# Patient Record
Sex: Male | Born: 1980 | Race: White | Hispanic: No | Marital: Married | Smoking: Never smoker
Health system: Southern US, Community
[De-identification: ages and names within clinical notes are randomized; demographics above are authoritative.]

## PROBLEM LIST (undated history)

## (undated) DIAGNOSIS — J309 Allergic rhinitis, unspecified: Secondary | ICD-10-CM

## (undated) DIAGNOSIS — K760 Fatty (change of) liver, not elsewhere classified: Secondary | ICD-10-CM

## (undated) DIAGNOSIS — K5100 Ulcerative (chronic) pancolitis without complications: Secondary | ICD-10-CM

## (undated) HISTORY — PX: CHOLECYSTECTOMY: SHX55

## (undated) HISTORY — PX: COLONOSCOPY: SHX174

---

## 2006-03-17 ENCOUNTER — Ambulatory Visit: Payer: Self-pay | Admitting: Unknown Physician Specialty

## 2010-06-04 ENCOUNTER — Ambulatory Visit: Payer: Self-pay

## 2010-09-03 ENCOUNTER — Ambulatory Visit: Payer: Self-pay | Admitting: Unknown Physician Specialty

## 2010-09-06 LAB — PATHOLOGY REPORT

## 2010-12-03 ENCOUNTER — Emergency Department: Payer: Self-pay | Admitting: Emergency Medicine

## 2012-01-03 ENCOUNTER — Ambulatory Visit: Payer: Self-pay | Admitting: Internal Medicine

## 2012-01-05 ENCOUNTER — Ambulatory Visit: Payer: Self-pay | Admitting: Anesthesiology

## 2012-01-11 ENCOUNTER — Ambulatory Visit: Payer: Self-pay | Admitting: Surgery

## 2012-01-12 LAB — PATHOLOGY REPORT

## 2014-02-07 ENCOUNTER — Ambulatory Visit: Payer: Self-pay | Admitting: Unknown Physician Specialty

## 2014-02-11 LAB — PATHOLOGY REPORT

## 2014-02-14 ENCOUNTER — Ambulatory Visit: Payer: Self-pay | Admitting: Unknown Physician Specialty

## 2015-03-08 NOTE — Op Note (Signed)
PATIENT NAME:  Hector LyonsKINS, Hector Hill MR#:  [id number] DATE OF BIRTH:  04-21-1981  DATE OF PROCEDURE:  01/11/2012  PREOPERATIVE DIAGNOSIS: Chronic cholecystitis.   POSTOPERATIVE DIAGNOSIS: Chronic cholecystitis, cholelithiasis.   PROCEDURE: Laparoscopic cholecystectomy, cholangiogram.   SURGEON: Renda RollsWilton Smith, MD   ANESTHESIA: General.   INDICATIONS: This 34 year old male has a history of epigastric pains. He had ultrasound findings that demonstrated some sludge within the gallbladder, some thickening of the gallbladder wall. Surgery was recommended for definitive treatment.   DESCRIPTION OF PROCEDURE: The patient was placed on the operating table in the supine position under general endotracheal anesthesia. The abdomen was clipped and prepared with ChloraPrep and draped in a sterile manner.   The patient was quite obese. An incision was made deep within the inferior aspect of the umbilicus, carried down to the deep fascia which was grasped with a laryngeal hook and elevated. A Veress needle was inserted, aspirated, and irrigated with a saline solution. Next, the peritoneal cavity was inflated with carbon dioxide. The Veress needle was removed. The 10 mm cannula was inserted. The 10 mm, 0-degree laparoscope was inserted to view the peritoneal cavity. Another incision was made in the epigastrium just to the right of the midline to introduce a 10 mm cannula. Two incisions were made in the lateral aspect of the right upper quadrant to introduce two 5-mm cannulas.   Initial survey revealed the liver was normal in appearance. The gallbladder had a slightly thickened wall. The visible intestines appeared to be typical. The stomach was with a small amount of gas.   The gallbladder was retracted towards the right shoulder. A number of adhesions were taken down with blunt and sharp dissection. The pouch of Morison was retracted inferiorly and laterally. The porta hepatis was demonstrated. The cystic duct was  dissected free from surrounding structures. The cystic artery was dissected free from surrounding structures. The neck of the gallbladder was mobilized with incision of the visceral peritoneum. A critical view of safety was demonstrated. An EndoClip was placed across the cystic duct adjacent to the neck of the gallbladder. An incision was made in the cystic duct. There was some dark bile within the cystic duct which was aspirated. Next, the Reddick catheter was inserted, and the cholangiogram was done with fluoroscopy demonstrating the biliary tree and flow of dye into the duodenum. No retained stones were seen. The Reddick catheter was removed. The cystic duct was doubly ligated with EndoClips and divided. The cystic artery was controlled with EndoClips and divided. The gallbladder was dissected free from the liver. Another branch of the cystic artery was controlled with an EndoClip and divided. Further dissection was carried out completely separating the gallbladder from the liver. It is noted that during the course of the procedure numerous small bleeding points were cauterized. Hemostasis was subsequently intact. The gallbladder was brought up through the infraumbilical incision, opened and suctioned. It did contain thick bile, and with additional manipulation the gallbladder was completely removed,  and I could palpate small stones within the gallbladder. The gallbladder was sent with stones in formalin for routine pathology. The right upper quadrant was further inspected. Hemostasis was intact. The cannulas were removed. Carbon dioxide was allowed to escape from the peritoneal cavity. Skin incisions were closed with interrupted 5-0 chromic subcuticular sutures, benzoin, and Steri-Strips. Dressings were applied with paper tape. The patient tolerated the procedure satisfactorily and was then prepared for transfer to the recovery room.    ____________________________ Shela CommonsJ. Renda RollsWilton Smith, MD jws:cbb D:  01/11/2012  11:40:52 ET T: 01/11/2012 11:49:07 ET JOB#: [id number]  cc: Adella Hare, MD, <Dictator> Adella Hare MD ELECTRONICALLY SIGNED 01/11/2012 15:07

## 2015-09-21 IMAGING — CT CT ABD-PELV W/ CM
2 of 4 series · 16 of 46 positions shown, 18 images · IV contrast (isovue)
Comparison: None.

CLINICAL DATA: Abdominal pain and bloating. History of ulcerative
colitis.

EXAM:
CT ABDOMEN AND PELVIS WITH CONTRAST
TECHNIQUE: Multidetector CT imaging of the abdomen and pelvis was performed
using the standard protocol following bolus administration of
intravenous contrast.
CONTRAST:  125 cc Isovue 370

[Series 2: routine abd pel with · axial · 0.92mm/px · z∈[-968,-492]mm · 13 of 105 slices shown, 15 images]
[im 5/105  soft-tissue]
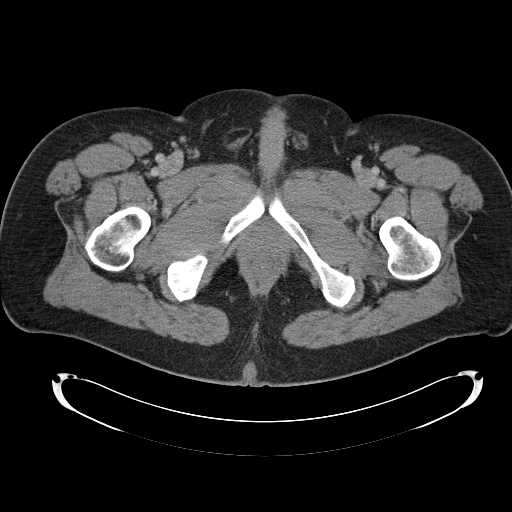
[im 5/105  bone]
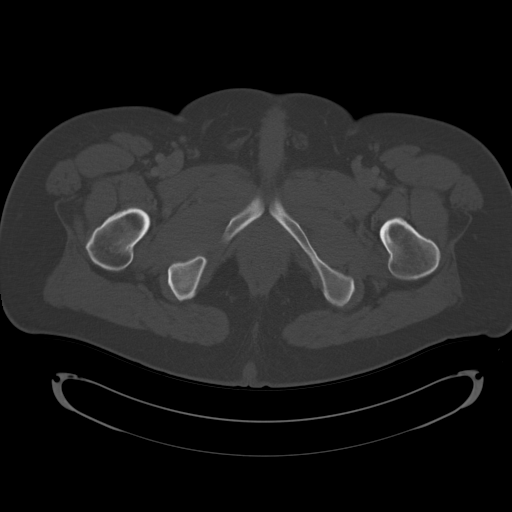
[im 14/105  soft-tissue]
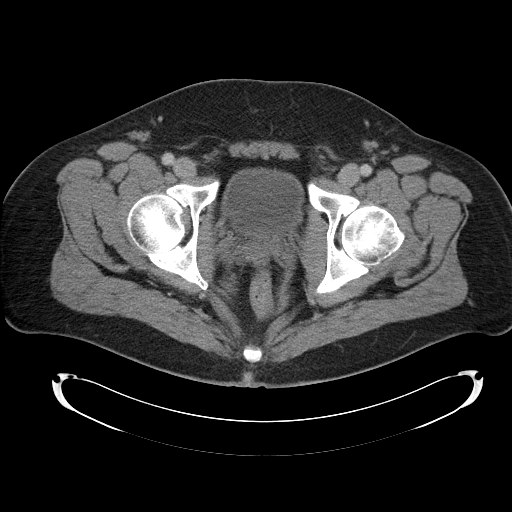
[im 23/105  soft-tissue]
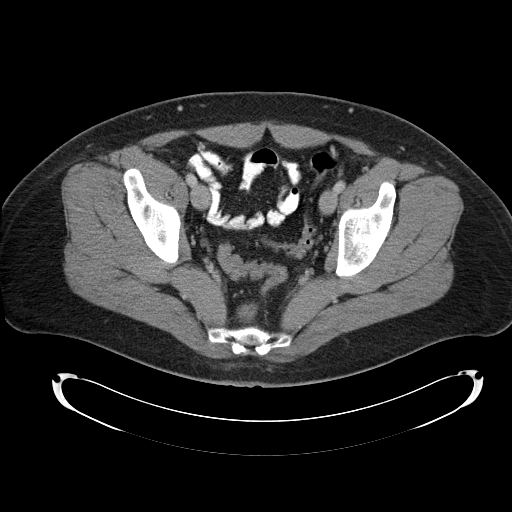
[im 28/105  soft-tissue]
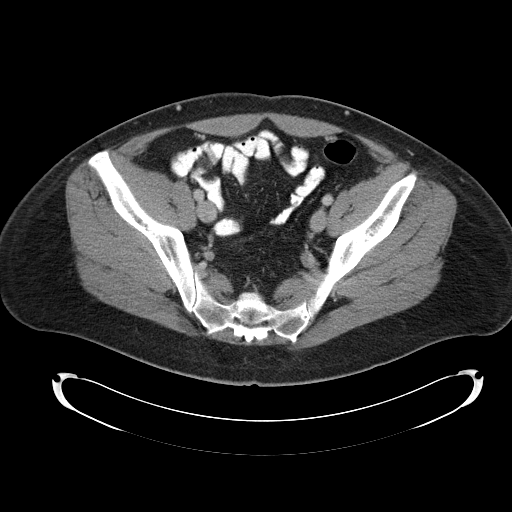
[im 37/105  soft-tissue]
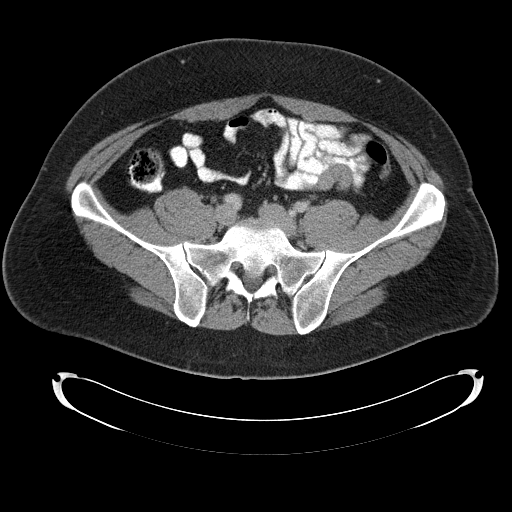
[im 46/105  soft-tissue]
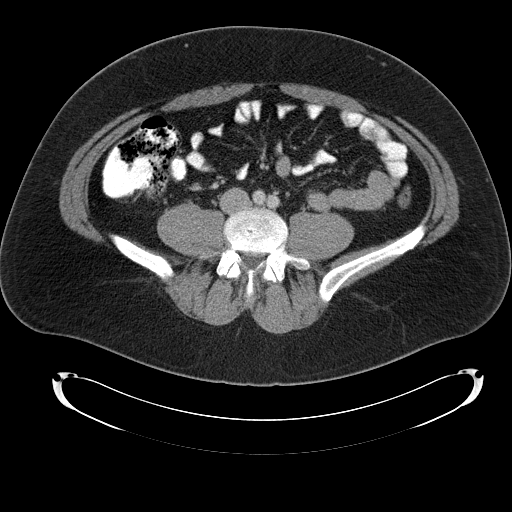
[im 55/105  soft-tissue]
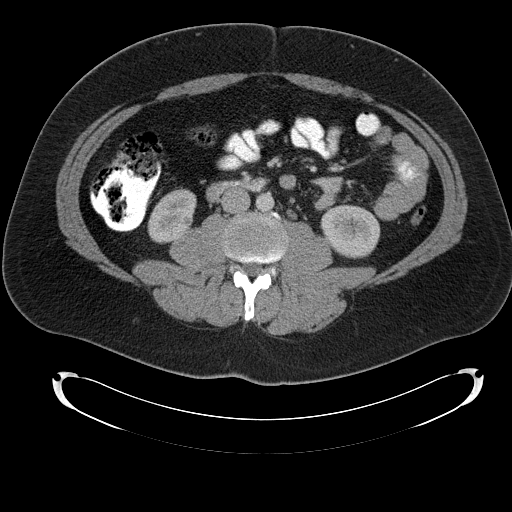
[im 59/105  soft-tissue]
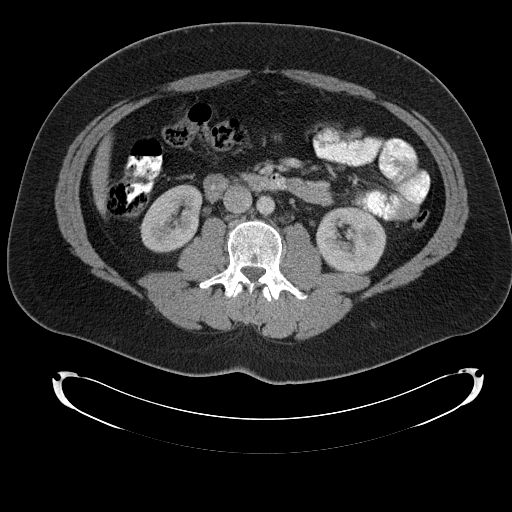
[im 68/105  soft-tissue]
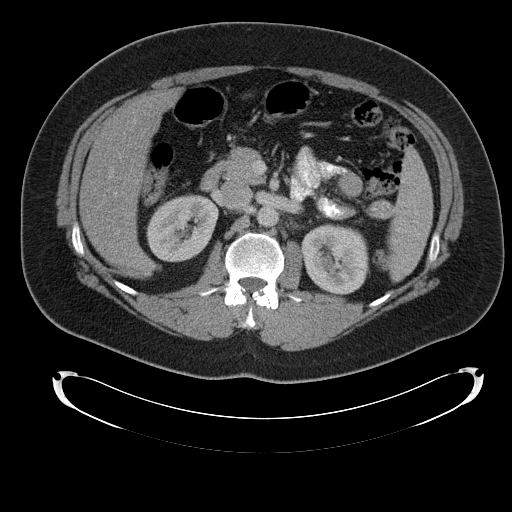
[im 68/105  bone]
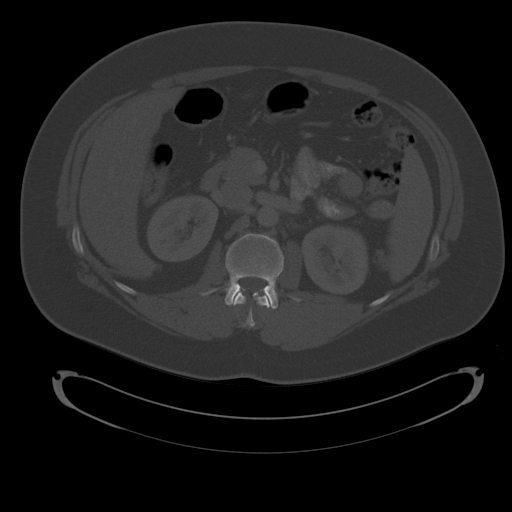
[im 77/105  soft-tissue]
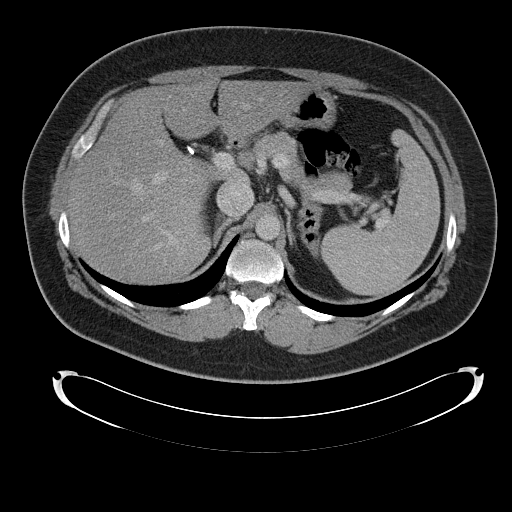
[im 82/105  soft-tissue]
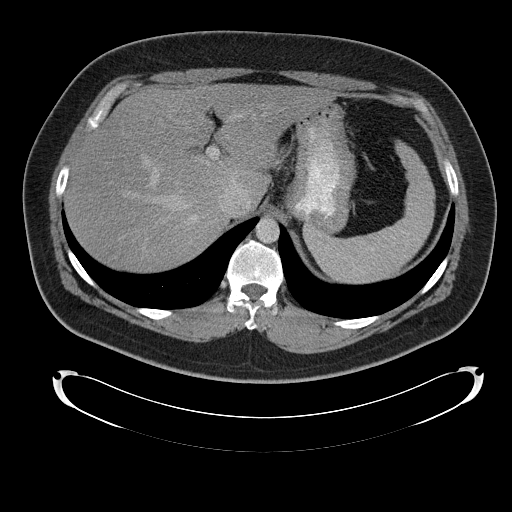
[im 91/105  soft-tissue]
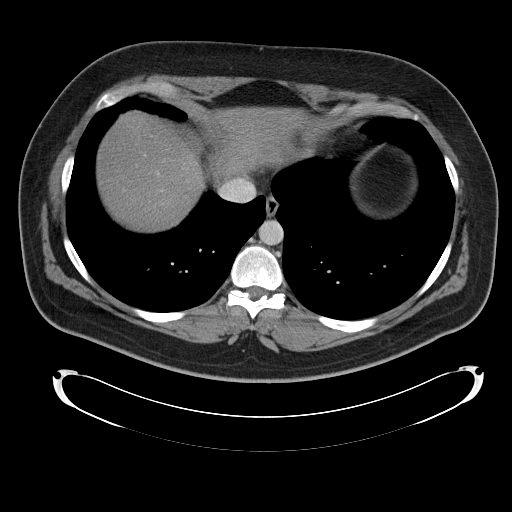
[im 100/105  soft-tissue]
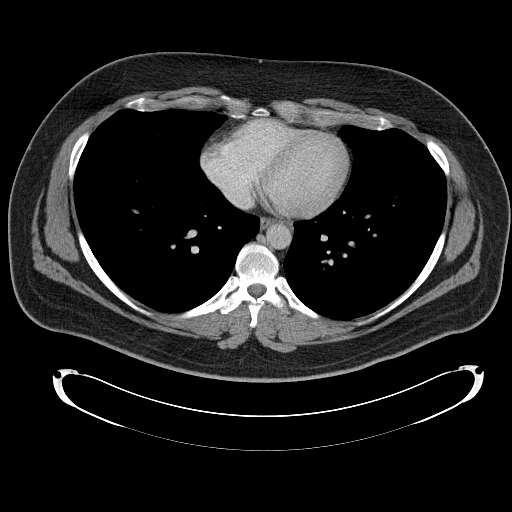

[Series 5: cor routine abd pel with · coronal · 0.70mm/px · 3 of 149 slices shown]
[im 50/149  soft-tissue]
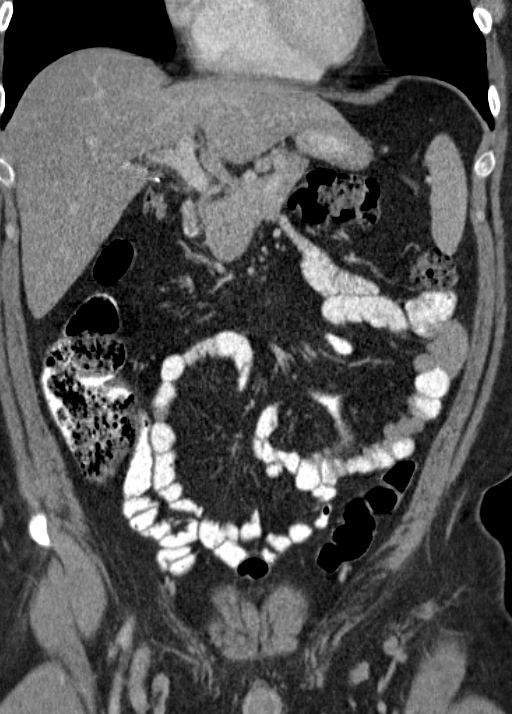
[im 66/149  soft-tissue]
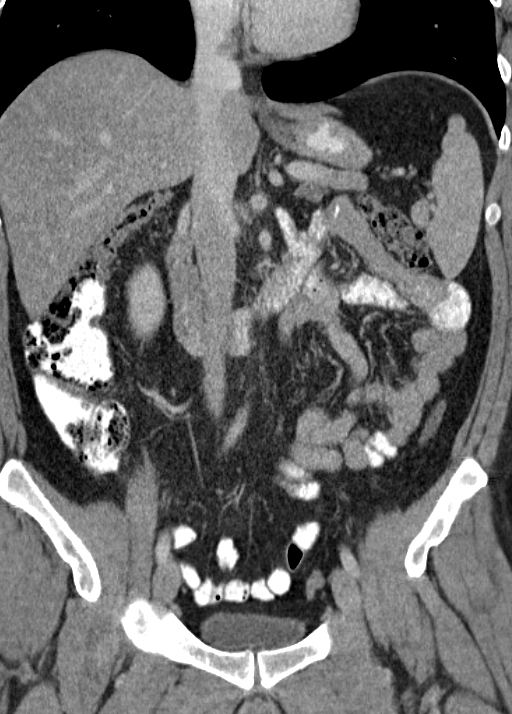
[im 83/149  soft-tissue]
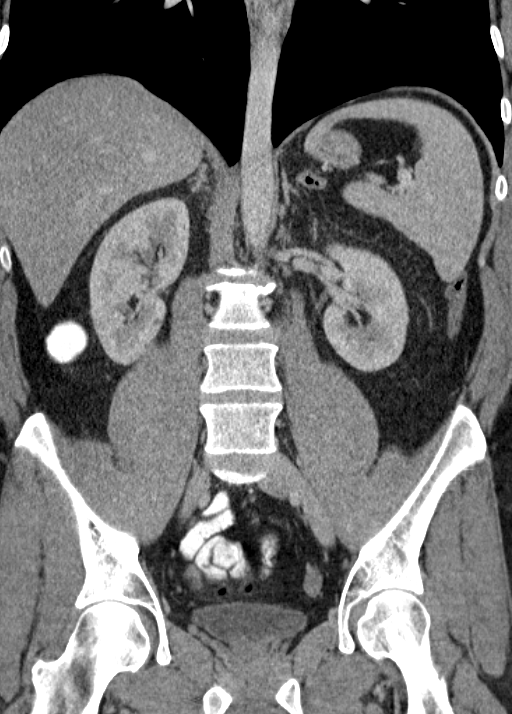

[16 of 46 positions shown; findings below may reference images not displayed]

FINDINGS: The lung bases are clear. No pleural effusion or pulmonary nodules.
The heart is normal in size. No pericardial effusion. The distal
esophagus is grossly normal.

There is fairly marked and diffuse fatty infiltration of the liver.
No focal hepatic lesions or intrahepatic biliary dilatation. The
gallbladder is surgically absent. No common bile duct dilatation the
pancreas is normal. The spleen is mildly enlarged measuring 15 x 11
x 12 cm. No focal lesions. The adrenal glands and kidneys are
normal.

The stomach, duodenum, small bowel and colon are unremarkable. There
is scattered stool in the colon. No wall thickening or inflammatory
changes to suggest active colonic inflammation. No mass lesions or
obstructive findings. The terminal ileum is normal.

No mesenteric or retroperitoneal mass or adenopathy. Small scattered
lymph nodes are noted. The aorta and branch vessels are normal. The
major venous structures are patent.

The bladder, prostate gland and seminal vesicles are unremarkable.
No pelvic mass, adenopathy or free pelvic fluid collections. No
inguinal mass or adenopathy.

The bony structures are unremarkable.
IMPRESSION: No acute abdominal/ pelvic findings, mass lesions or adenopathy.
Specifically, no findings to suggest active inflammatory bowel
disease.

Diffuse fatty infiltration of the liver.

Mild splenomegaly.

## 2024-01-23 ENCOUNTER — Encounter: Payer: Self-pay | Admitting: *Deleted

## 2024-01-25 ENCOUNTER — Encounter: Payer: Self-pay | Admitting: *Deleted

## 2024-02-02 ENCOUNTER — Encounter: Payer: Self-pay | Admitting: Gastroenterology

## 2024-02-02 ENCOUNTER — Ambulatory Visit: Admitting: Anesthesiology

## 2024-02-02 ENCOUNTER — Ambulatory Visit
Admission: RE | Admit: 2024-02-02 | Discharge: 2024-02-02 | Disposition: A | Discharge status: Home / Self Care | Payer: 59 | Attending: Gastroenterology | Admitting: Gastroenterology

## 2024-02-02 ENCOUNTER — Encounter
Admission: RE | Disposition: A | Discharge status: Home / Self Care | Payer: Self-pay | Source: Home / Self Care | Attending: Gastroenterology

## 2024-02-02 DIAGNOSIS — K649 Unspecified hemorrhoids: Secondary | ICD-10-CM | POA: Diagnosis not present

## 2024-02-02 DIAGNOSIS — R197 Diarrhea, unspecified: Secondary | ICD-10-CM | POA: Diagnosis not present

## 2024-02-02 DIAGNOSIS — K640 First degree hemorrhoids: Secondary | ICD-10-CM | POA: Insufficient documentation

## 2024-02-02 DIAGNOSIS — K5190 Ulcerative colitis, unspecified, without complications: Secondary | ICD-10-CM | POA: Insufficient documentation

## 2024-02-02 HISTORY — DX: Fatty (change of) liver, not elsewhere classified: K76.0

## 2024-02-02 HISTORY — DX: Allergic rhinitis, unspecified: J30.9

## 2024-02-02 HISTORY — DX: Ulcerative (chronic) pancolitis without complications: K51.00

## 2024-02-02 HISTORY — PX: COLONOSCOPY WITH PROPOFOL: SHX5780

## 2024-02-02 SURGERY — COLONOSCOPY WITH PROPOFOL
Anesthesia: General

## 2024-02-02 MED ORDER — EPHEDRINE 5 MG/ML INJ
INTRAVENOUS | Status: AC
  Filled 2024-02-02: qty 5

## 2024-02-02 MED ORDER — PROPOFOL 10 MG/ML IV BOLUS
INTRAVENOUS | Status: DC | PRN
  Administered 2024-02-02 (×2): 50 mg via INTRAVENOUS

## 2024-02-02 MED ORDER — PROPOFOL 1000 MG/100ML IV EMUL
INTRAVENOUS | Status: AC
  Filled 2024-02-02: qty 100

## 2024-02-02 MED ORDER — PHENYLEPHRINE 80 MCG/ML (10ML) SYRINGE FOR IV PUSH (FOR BLOOD PRESSURE SUPPORT)
PREFILLED_SYRINGE | INTRAVENOUS | Status: AC
Start: 2024-02-02 — End: ?
  Filled 2024-02-02: qty 10

## 2024-02-02 MED ORDER — DEXMEDETOMIDINE HCL IN NACL 80 MCG/20ML IV SOLN
INTRAVENOUS | Status: DC | PRN
  Administered 2024-02-02: 20 ug via INTRAVENOUS

## 2024-02-02 MED ORDER — SODIUM CHLORIDE 0.9 % IV SOLN
INTRAVENOUS | Status: DC
  Administered 2024-02-02: 20 mL/h via INTRAVENOUS

## 2024-02-02 MED ORDER — LIDOCAINE HCL (PF) 2 % IJ SOLN
INTRAMUSCULAR | Status: AC
  Filled 2024-02-02: qty 5

## 2024-02-02 MED ORDER — EPHEDRINE SULFATE-NACL 50-0.9 MG/10ML-% IV SOSY
PREFILLED_SYRINGE | INTRAVENOUS | Status: DC | PRN
  Administered 2024-02-02: 15 mg via INTRAVENOUS
  Administered 2024-02-02: 10 mg via INTRAVENOUS

## 2024-02-02 MED ORDER — PROPOFOL 500 MG/50ML IV EMUL
INTRAVENOUS | Status: DC | PRN
  Administered 2024-02-02: 75 ug/kg/min via INTRAVENOUS

## 2024-02-02 MED ORDER — LIDOCAINE HCL (CARDIAC) PF 100 MG/5ML IV SOSY
PREFILLED_SYRINGE | INTRAVENOUS | Status: DC | PRN
  Administered 2024-02-02: 50 mg via INTRAVENOUS

## 2024-02-02 MED ORDER — PHENYLEPHRINE 80 MCG/ML (10ML) SYRINGE FOR IV PUSH (FOR BLOOD PRESSURE SUPPORT)
PREFILLED_SYRINGE | INTRAVENOUS | Status: DC | PRN
  Administered 2024-02-02 (×5): 160 ug via INTRAVENOUS

## 2024-02-02 NOTE — Anesthesia Postprocedure Evaluation (Signed)
 Anesthesia Post Note  Patient: Hector Hill  Procedure(s) Performed: COLONOSCOPY WITH PROPOFOL  Patient location during evaluation: PACU Anesthesia Type: General Level of consciousness: awake Pain management: satisfactory to patient Vital Signs Assessment: post-procedure vital signs reviewed and stable Respiratory status: spontaneous breathing Cardiovascular status: stable Anesthetic complications: no   No notable events documented.   Last Vitals:  Vitals:   02/02/24 1153 02/02/24 1203  BP: (!) 100/47 (!) 105/50  Pulse: 79 72  Resp: 17 17  Temp:    SpO2: 99% 100%    Last Pain:  Vitals:   02/02/24 1203  TempSrc:   PainSc: 0-No pain                 VAN STAVEREN,Elba Dendinger

## 2024-02-02 NOTE — Transfer of Care (Signed)
 Immediate Anesthesia Transfer of Care Note  Patient: Hector Hill  Procedure(s) Performed: COLONOSCOPY WITH PROPOFOL  Patient Location: PACU  Anesthesia Type:General  Level of Consciousness: sedated  Airway & Oxygen Therapy: Patient Spontanous Breathing  Post-op Assessment: Report given to RN and Post -op Vital signs reviewed and stable  Post vital signs: Reviewed and stable  Last Vitals:  Vitals Value Taken Time  BP 92/44 02/02/24 1143  Temp    Pulse 70 02/02/24 1143  Resp 18 02/02/24 1143  SpO2 99 % 02/02/24 1143  Vitals shown include unfiled device data.  Last Pain:  Vitals:   02/02/24 1045  TempSrc: Temporal  PainSc: 0-No pain         Complications: No notable events documented.

## 2024-02-02 NOTE — H&P (Signed)
 Outpatient short stay form Pre-procedure 02/02/2024  [name], MD  Primary Physician: Dorothey Baseman, MD  Reason for visit:  UC surveillance  History of present illness:    43 y/o Hill with history of UC here for surveillance. Last colonoscopy in 2021 was unremarkable. No blood thinners. History of cholecystectomy. No family history of IBD or colon cancer.    Current Facility-Administered Medications:    0.9 %  sodium chloride infusion, , Intravenous, Continuous, Elma Shands, Rossie Muskrat, MD, Last Rate: 20 mL/hr at 02/02/24 1052, 20 mL/hr at 02/02/24 1052  Medications Prior to Admission  Medication Sig Dispense Refill Last Dose/Taking   b complex vitamins capsule Take 1 capsule by mouth daily.   Past Week   balsalazide (COLAZAL) 750 MG capsule Take 2,250 mg by mouth in the morning and at bedtime.   02/01/2024   cetirizine (ZYRTEC) 10 MG tablet Take 10 mg by mouth daily.   Past Week   magnesium oxide (MAG-OX) 400 (240 Mg) MG tablet Take 400 mg by mouth daily.   Past Week   Omega-3 Fatty Acids (FISH OIL) 500 MG CAPS Take 500 mg by mouth daily.   Past Week   potassium chloride (KLOR-CON) 8 MEQ tablet Take 8 mEq by mouth daily.   Past Week   Vitamin D, Ergocalciferol, (DRISDOL) 1.25 MG (50000 UNIT) CAPS capsule Take 50,000 Units by mouth every 7 (seven) days.   Past Week     Allergies  Allergen Reactions   Penicillins Other (See Comments)     Past Medical History:  Diagnosis Date   Allergic rhinitis    Fatty liver    Ulcerative (chronic) enterocolitis (HCC)     Review of systems:  Otherwise negative.    Physical Exam  Gen: Alert, oriented. Appears stated age.  HEENT: PERRLA. Lungs: No respiratory distress CV: RRR Abd: soft, benign, no masses Ext: No edema    Planned procedures: Proceed with colonoscopy. The patient understands the nature of the planned procedure, indications, risks, alternatives and potential complications including but not limited to  bleeding, infection, perforation, damage to internal organs and possible oversedation/side effects from anesthesia. The patient agrees and gives consent to proceed.  Please refer to procedure notes for findings, recommendations and patient disposition/instructions.     [name], MD [facility]

## 2024-02-02 NOTE — Op Note (Signed)
 [facility] Patient Name: Flem Enderle Procedure Date: 02/02/2024 11:02 AM MRN: [id number] Account #: [id number] Date of Birth: 04-Sep-1981 Admit Type: Outpatient Age: 43 Room: [facility] ENDO ROOM 3 Gender: Male Note Status: Finalized Instrument Name: Prentice Docker [id number] Procedure:             Colonoscopy Indications:           Ulcerative colitis Providers:             Eather Colas MD, MD Referring MD:          no pcp Medicines:             Monitored Anesthesia Care Complications:         No immediate complications. Estimated blood loss:                         Minimal. Procedure:             Pre-Anesthesia Assessment:                        - Prior to the procedure, a History and Physical was                         performed, and patient medications and allergies were                         reviewed. The patient is competent. The risks and                         benefits of the procedure and the sedation options and                         risks were discussed with the patient. All questions                         were answered and informed consent was obtained.                         Patient identification and proposed procedure were                         verified by the physician, the nurse, the                         anesthesiologist, the anesthetist and the technician                         in the endoscopy suite. Mental Status Examination:                         alert and oriented. Airway Examination: normal                         oropharyngeal airway and neck mobility. Respiratory                         Examination: clear to auscultation. CV Examination:                         normal. Prophylactic  Antibiotics: The patient does not                         require prophylactic antibiotics. Prior                         Anticoagulants: The patient has taken no anticoagulant                         or antiplatelet agents. ASA Grade  Assessment: I - A                         normal, healthy patient. After reviewing the risks and                         benefits, the patient was deemed in satisfactory                         condition to undergo the procedure. The anesthesia                         plan was to use monitored anesthesia care (MAC).                         Immediately prior to administration of medications,                         the patient was re-assessed for adequacy to receive                         sedatives. The heart rate, respiratory rate, oxygen                         saturations, blood pressure, adequacy of pulmonary                         ventilation, and response to care were monitored                         throughout the procedure. The physical status of the                         patient was re-assessed after the procedure.                        After obtaining informed consent, the colonoscope was                         passed under direct vision. Throughout the procedure,                         the patient's blood pressure, pulse, and oxygen                         saturations were monitored continuously. The                         Colonoscope was introduced through the anus and  advanced to the the terminal ileum, with                         identification of the appendiceal orifice and IC                         valve. The colonoscopy was somewhat difficult due to a                         redundant colon. The patient tolerated the procedure                         well. The quality of the bowel preparation was good.                         The terminal ileum, ileocecal valve, appendiceal                         orifice, and rectum were photographed. Findings:      The perianal and digital rectal examinations were normal.      The terminal ileum appeared normal.      Normal mucosa was found in the entire colon. Biopsies were taken with a       cold  forceps for histology. Estimated blood loss was minimal.      Internal hemorrhoids were found during retroflexion. The hemorrhoids       were Grade I (internal hemorrhoids that do not prolapse).      The exam was otherwise without abnormality on direct and retroflexion       views. Impression:            - The examined portion of the ileum was normal.                        - Normal mucosa in the entire examined colon. Biopsied.                        - Internal hemorrhoids.                        - The examination was otherwise normal on direct and                         retroflexion views. Recommendation:        - Discharge patient to home.                        - Resume previous diet.                        - Continue present medications.                        - Await pathology results.                        - Repeat colonoscopy date to be determined after                         pending pathology results are reviewed for  surveillance based on pathology results. Would                         consider extending surveillance interval to 5 years                         given long term stability.                        - Return to referring physician as previously                         scheduled. Procedure Code(s):     --- Professional ---                        [phone], Colonoscopy, flexible; with biopsy, single or                         multiple Diagnosis Code(s):     --- Professional ---                        K64.0, First degree hemorrhoids                        K51.90, Ulcerative colitis, unspecified, without                         complications CPT copyright 2022 American Medical Association. All rights reserved. The codes documented in this report are preliminary and upon coder review may  be revised to meet current compliance requirements. Eather Colas MD, MD 02/02/2024 11:46:00 AM Number of Addenda: 0 Note Initiated On: 02/02/2024 11:02  AM Scope Withdrawal Time: 0 hours 10 minutes 40 seconds  Total Procedure Duration: 0 hours 20 minutes 45 seconds  Estimated Blood Loss:  Estimated blood loss was minimal.      [facility]

## 2024-02-02 NOTE — Interval H&P Note (Signed)
 History and Physical Interval Note:  02/02/2024 11:12 AM  Hector Hill  has presented today for surgery, with the diagnosis of Ulcerative colitis without complications, unspecified location.  The various methods of treatment have been discussed with the patient and family. After consideration of risks, benefits and other options for treatment, the patient has consented to  Procedure(s): COLONOSCOPY WITH PROPOFOL (N/A) as a surgical intervention.  The patient's history has been reviewed, patient examined, no change in status, stable for surgery.  I have reviewed the patient's chart and labs.  Questions were answered to the patient's satisfaction.     [name]  Ok to proceed with colonoscopy

## 2024-02-02 NOTE — Anesthesia Preprocedure Evaluation (Signed)
 Anesthesia Evaluation  Patient identified by MRN, date of birth, ID band Patient awake    Reviewed: Allergy & Precautions, NPO status , Patient's Chart, lab work & pertinent test results  Airway Mallampati: II  TM Distance: >3 FB Neck ROM: full    Dental  (+) Teeth Intact   Pulmonary neg pulmonary ROS   Pulmonary exam normal breath sounds clear to auscultation       Cardiovascular Exercise Tolerance: Good negative cardio ROS Normal cardiovascular exam Rhythm:Regular Rate:Normal     Neuro/Psych negative neurological ROS  negative psych ROS   GI/Hepatic negative GI ROS, Neg liver ROS, PUD,,,  Endo/Other  negative endocrine ROS    Renal/GU negative Renal ROS  negative genitourinary   Musculoskeletal negative musculoskeletal ROS (+)    Abdominal Normal abdominal exam  (+)   Peds negative pediatric ROS (+)  Hematology negative hematology ROS (+)   Anesthesia Other Findings Past Medical History: No date: Allergic rhinitis No date: Fatty liver No date: Ulcerative (chronic) enterocolitis (HCC)  Past Surgical History: No date: CHOLECYSTECTOMY No date: COLONOSCOPY; N/A  BMI    Body Mass Index: 27.46 kg/m      Reproductive/Obstetrics negative OB ROS                             Anesthesia Physical Anesthesia Plan  ASA: 1  Anesthesia Plan: General   Post-op Pain Management:    Induction: Intravenous  PONV Risk Score and Plan: Propofol infusion and TIVA  Airway Management Planned: Natural Airway and Nasal Cannula  Additional Equipment:   Intra-op Plan:   Post-operative Plan:   Informed Consent: I have reviewed the patients History and Physical, chart, labs and discussed the procedure including the risks, benefits and alternatives for the proposed anesthesia with the patient or authorized representative who has indicated his/her understanding and acceptance.     Dental  Advisory Given  Plan Discussed with: CRNA  Anesthesia Plan Comments:        Anesthesia Quick Evaluation

## 2024-02-05 LAB — SURGICAL PATHOLOGY

## 2025-02-13 ENCOUNTER — Ambulatory Visit: Admitting: Nurse Practitioner
